# Patient Record
Sex: Male | Born: 1990 | Race: Asian | Hispanic: No | Marital: Single | Smoking: Never smoker
Health system: Southern US, Community
[De-identification: ages and names within clinical notes are randomized; demographics above are authoritative.]

---
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
## 2016-09-27 ENCOUNTER — Ambulatory Visit (INDEPENDENT_AMBULATORY_CARE_PROVIDER_SITE_OTHER): Payer: BLUE CROSS/BLUE SHIELD | Admitting: Family Medicine

## 2016-09-27 ENCOUNTER — Encounter: Payer: Self-pay | Admitting: Family Medicine

## 2016-09-27 ENCOUNTER — Ambulatory Visit (HOSPITAL_BASED_OUTPATIENT_CLINIC_OR_DEPARTMENT_OTHER)
Admission: RE | Admit: 2016-09-27 | Discharge: 2016-09-27 | Disposition: A | Discharge status: Home / Self Care | Payer: BLUE CROSS/BLUE SHIELD | Source: Ambulatory Visit | Attending: Family Medicine | Admitting: Family Medicine

## 2016-09-27 VITALS — BP 119/75 | HR 71 | Ht 74.0 in | Wt 185.0 lb

## 2016-09-27 DIAGNOSIS — M25521 Pain in right elbow: Secondary | ICD-10-CM | POA: Diagnosis not present

## 2016-09-27 DIAGNOSIS — S59901A Unspecified injury of right elbow, initial encounter: Secondary | ICD-10-CM | POA: Diagnosis not present

## 2016-09-27 NOTE — Patient Instructions (Signed)
Your x-rays are reassuring.  This is consistent with a supinator strain of your elbow. Start physical therapy and do home exercises on days you don't go to therapy. Aleve 2 tabs twice a day with food OR ibuprofen 600mg  three times a day with food for pain and inflammation as needed. I don't think you need a brace or sleeve for this. Be careful icing this on the inside part of your elbow as we discussed. Follow up with me in 4 weeks. If not improving as expected would consider an MRI.

## 2016-09-28 DIAGNOSIS — S59901A Unspecified injury of right elbow, initial encounter: Secondary | ICD-10-CM | POA: Insufficient documentation

## 2016-09-28 NOTE — Progress Notes (Signed)
PCP: Patient, No Pcp Per  Subjective:   HPI: Patient is a 26 y.o. male here for right elbow pain.  Patient reports on 5/24 he was bench pressing 135 pounds - on second set while pushing bar up and felt a sharp pain deep in right elbow. Since then has had trouble straightening his right elbow. Right handed. No swelling or bruising. Pain level up to 7/10 and sharp at worst anterior deep elbow. No prior injuries. No skin changes or numbness.  No past medical history on file.  No current outpatient prescriptions on file prior to visit.   No current facility-administered medications on file prior to visit.     No past surgical history on file.  No Known Allergies  Social History   Social History  . Marital status: Single    Spouse name: N/A  . Number of children: N/A  . Years of education: N/A   Occupational History  . Not on file.   Social History Main Topics  . Smoking status: Never Smoker  . Smokeless tobacco: Never Used  . Alcohol use Not on file  . Drug use: Unknown  . Sexual activity: Not on file   Other Topics Concern  . Not on file   Social History Narrative  . No narrative on file    No family history on file.  BP 119/75   Pulse 71   Ht 6\' 2"  (1.88 m)   Wt 185 lb (83.9 kg)   BMI 23.75 kg/m   Review of Systems: See HPI above.     Objective:  Physical Exam:  Gen: NAD, comfortable in exam room  Right elbow: No gross deformity, swelling, bruising. No tenderness circumferentially about elbow. FROM with pain on full extension. Pain only with supination > pronation of wrist.  No pain flexion/extension wrist and digits. Collateral ligaments intact. Negative tinels cubital and radial tunnels. NVI distally.  Left elbow: FROM without pain.   Assessment & Plan:  1. Right elbow injury - independently reviewed radiographs and no evidence fracture, effusion to suggest bony abnormalities.  Consistent with supinator strain based on exam.  Will start  with physical therapy, home exercises, aleve or ibuprofen.  Icing.  F/u in 4 weeks.  Consider MRI if not improving as expected.

## 2016-09-28 NOTE — Assessment & Plan Note (Signed)
independently reviewed radiographs and no evidence fracture, effusion to suggest bony abnormalities.  Consistent with supinator strain based on exam.  Will start with physical therapy, home exercises, aleve or ibuprofen.  Icing.  F/u in 4 weeks.  Consider MRI if not improving as expected.

## 2016-10-25 ENCOUNTER — Ambulatory Visit: Payer: BLUE CROSS/BLUE SHIELD | Admitting: Family Medicine

## 2017-11-08 IMAGING — DX DG ELBOW COMPLETE 3+V*R*
4 series · 4 of 4 positions shown · non-contrast
Comparison: None in PACs

CLINICAL DATA: Patient was doing bench presses and felt a pop in
the right elbow on [REDACTED]. Persistent elbow pain.

EXAM:
RIGHT ELBOW - COMPLETE 3+ VIEW

[elbow ap]
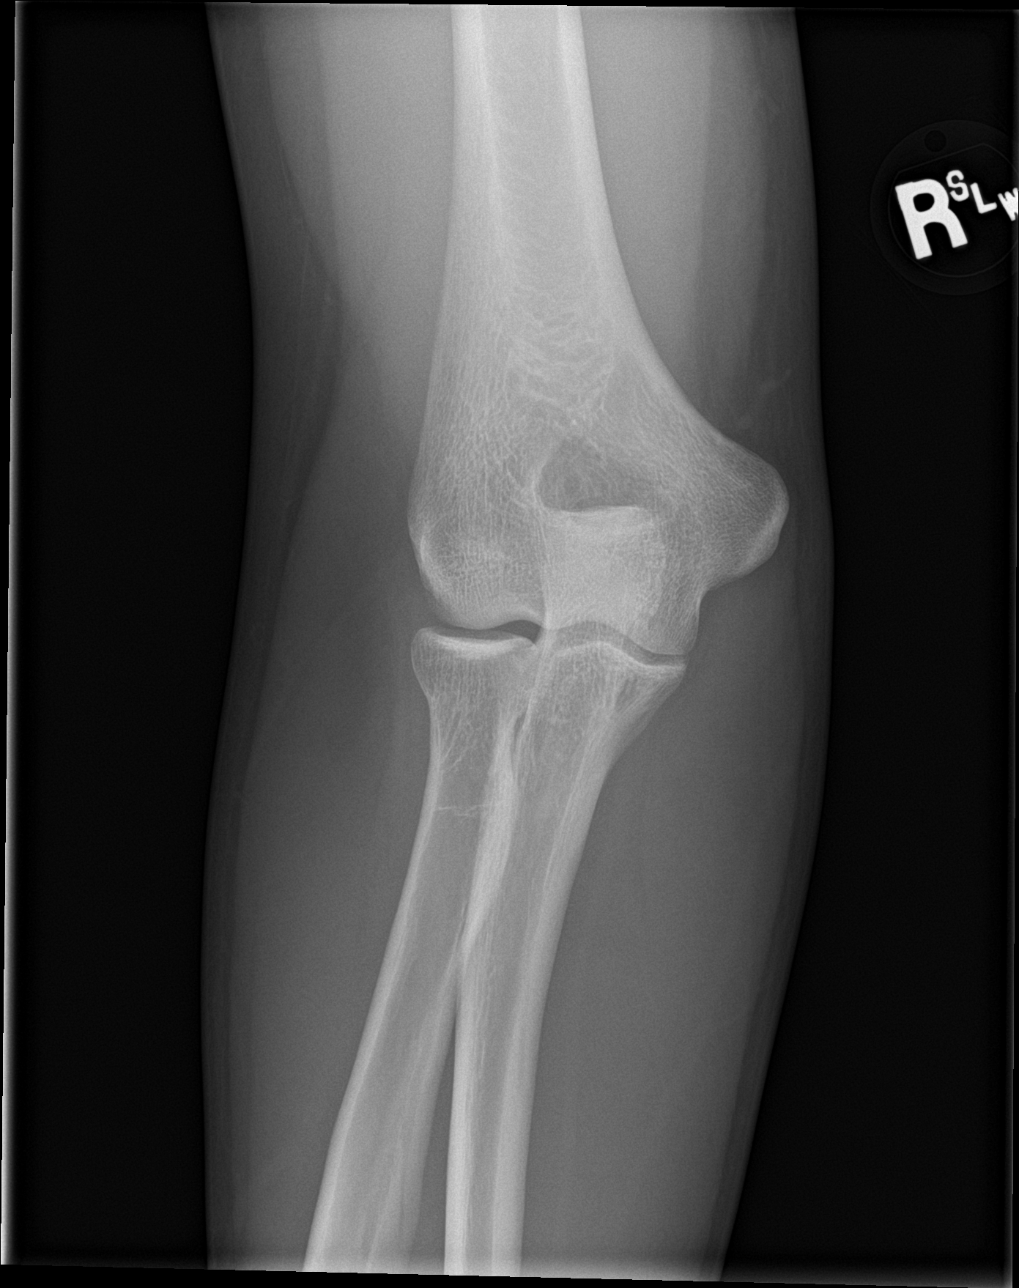

[elbow obl (1 of 2)]
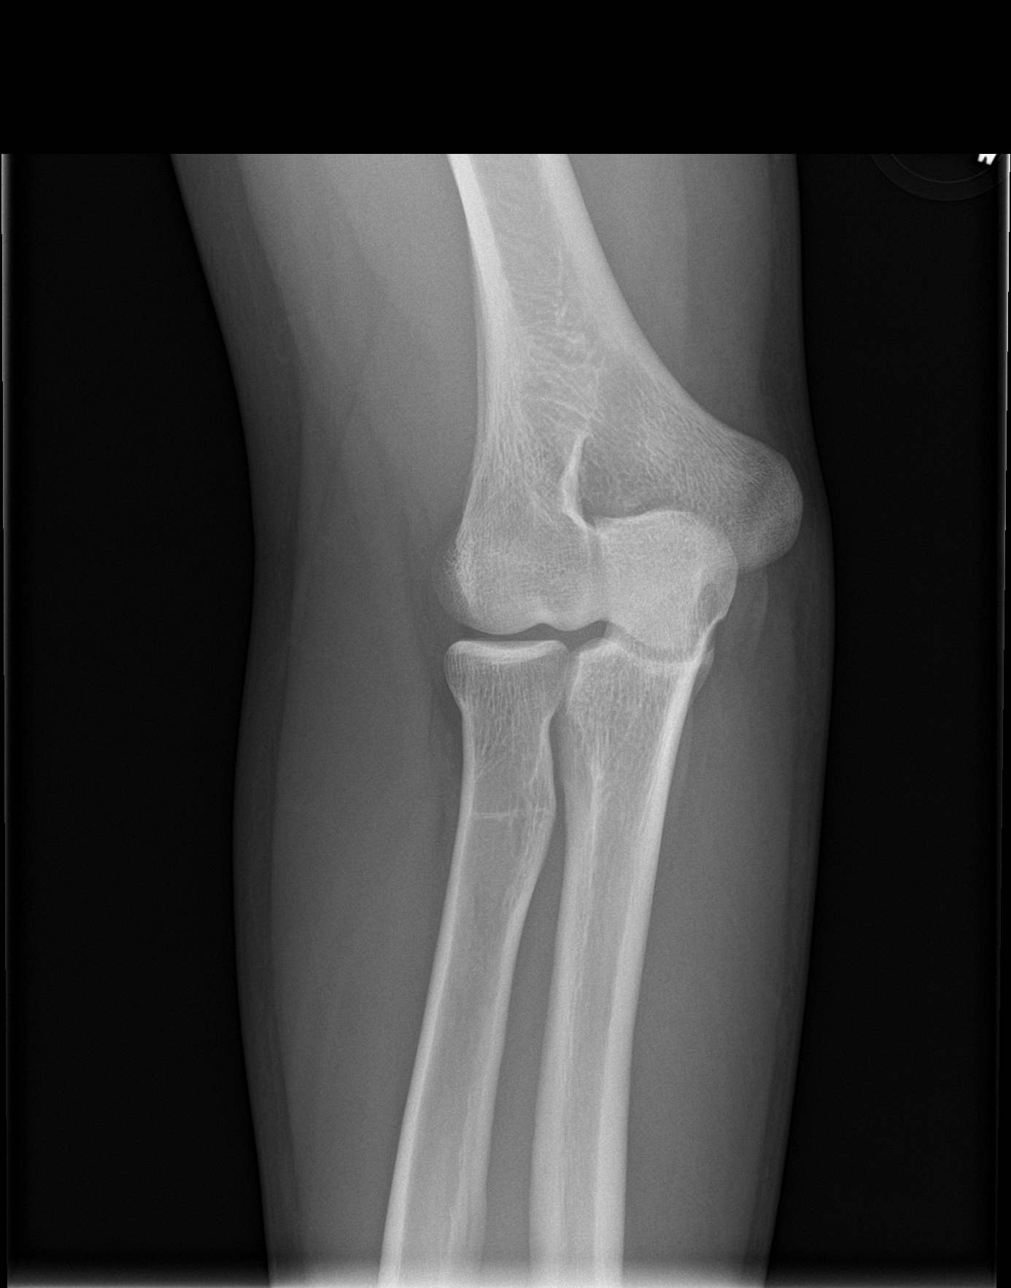

[elbow obl (2 of 2)]
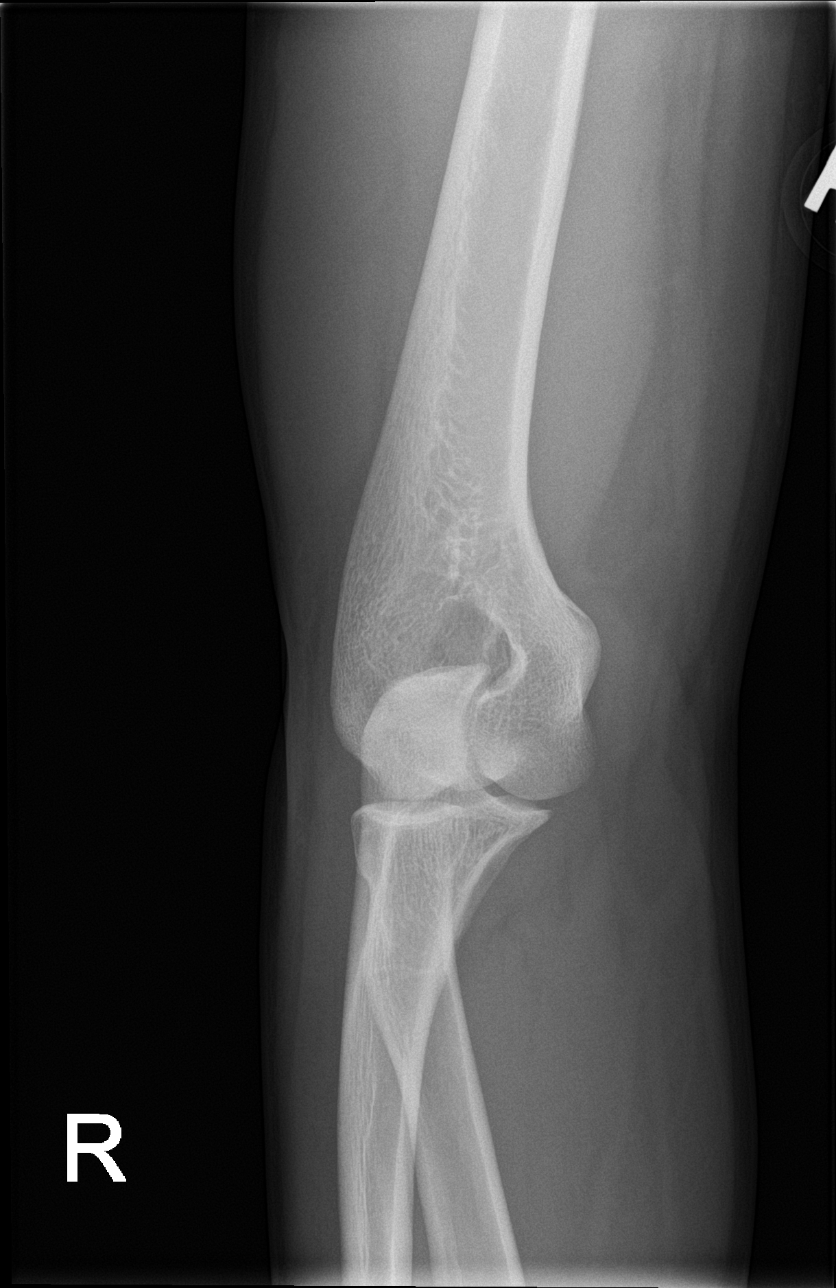

[elbow lat]
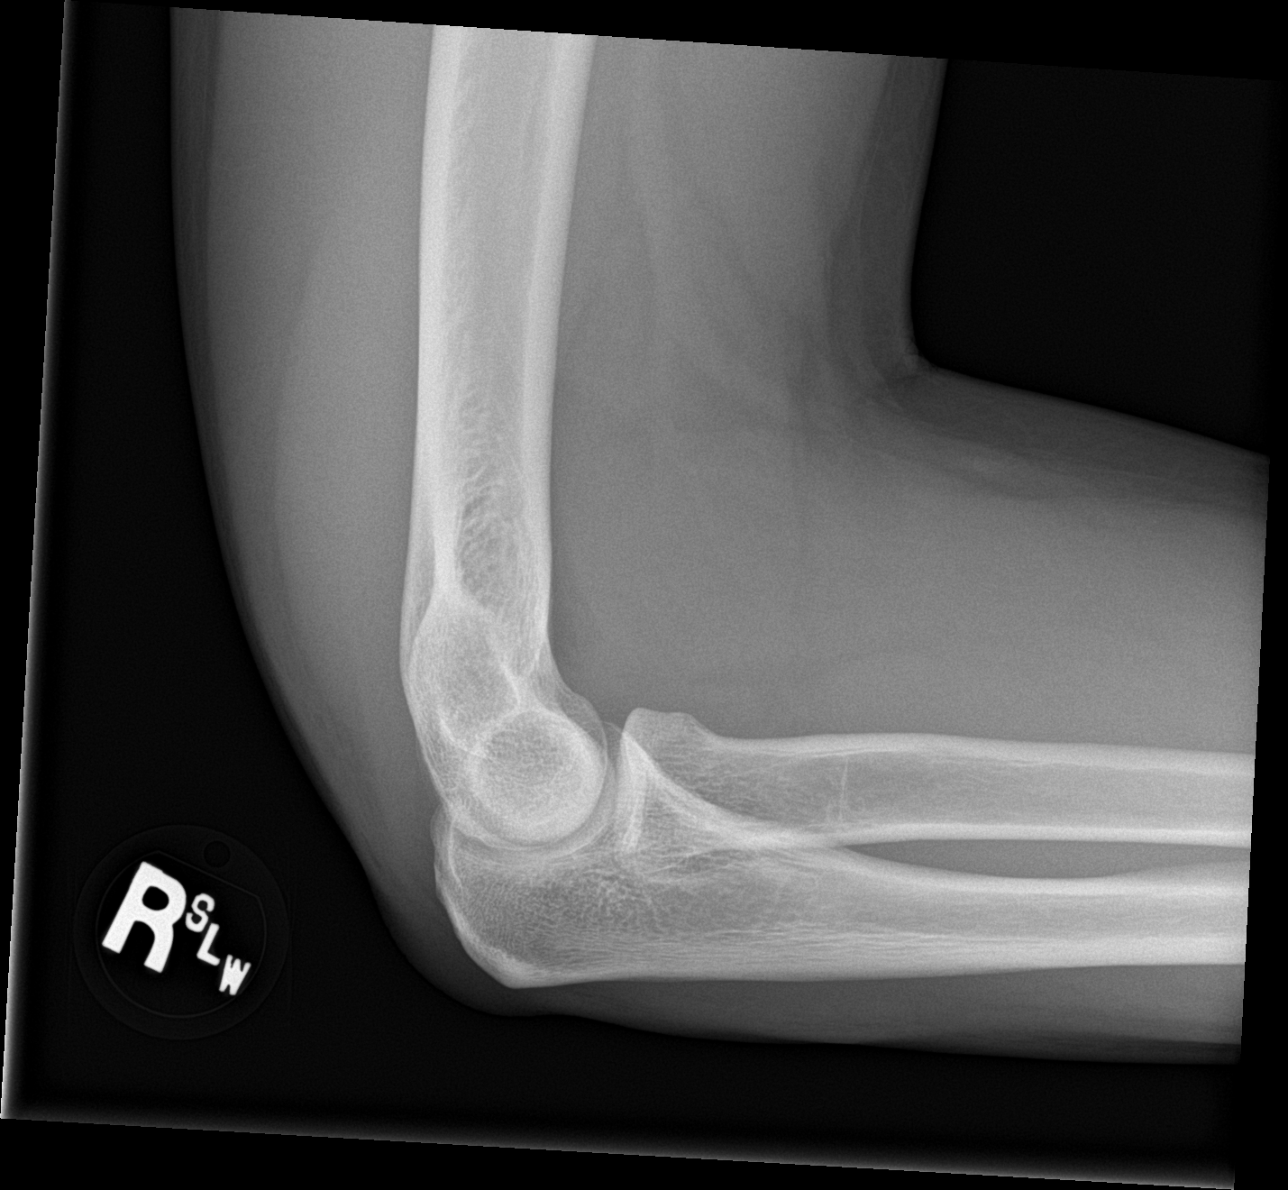

[4 of 4 positions shown; findings below may reference images not displayed]

FINDINGS: The bones are subjectively adequately mineralized. There is no acute
fracture nor dislocation. There is no joint effusion. No significant
degenerative change is observed.
IMPRESSION: There is no acute or significant chronic bony abnormality of the
right elbow.
